# Patient Record
Sex: Male | Born: 1955 | Race: White | Hispanic: No | Marital: Married | Smoking: Former smoker
Health system: Southern US, Community
[De-identification: ages and names within clinical notes are randomized; demographics above are authoritative.]

## PROBLEM LIST (undated history)

## (undated) HISTORY — PX: BACK SURGERY: SHX140

## (undated) HISTORY — PX: HERNIA REPAIR: SHX51

---

## 2005-10-28 ENCOUNTER — Ambulatory Visit (HOSPITAL_COMMUNITY): Admission: RE | Admit: 2005-10-28 | Discharge: 2005-10-28 | Payer: Self-pay | Admitting: Neurosurgery

## 2010-07-07 ENCOUNTER — Emergency Department (HOSPITAL_BASED_OUTPATIENT_CLINIC_OR_DEPARTMENT_OTHER)
Admission: EM | Admit: 2010-07-07 | Discharge: 2010-07-08 | Payer: Self-pay | Source: Home / Self Care | Admitting: Emergency Medicine

## 2010-07-13 LAB — URINALYSIS, ROUTINE W REFLEX MICROSCOPIC
Bilirubin Urine: NEGATIVE
Hgb urine dipstick: NEGATIVE
Ketones, ur: NEGATIVE mg/dL
Nitrite: NEGATIVE
Protein, ur: NEGATIVE mg/dL
Specific Gravity, Urine: 1.007 (ref 1.005–1.030)
Urine Glucose, Fasting: NEGATIVE mg/dL
Urobilinogen, UA: 0.2 mg/dL (ref 0.0–1.0)
pH: 7.5 (ref 5.0–8.0)

## 2010-07-13 LAB — DIFFERENTIAL
Basophils Absolute: 0 10*3/uL (ref 0.0–0.1)
Basophils Relative: 1 % (ref 0–1)
Eosinophils Absolute: 0.2 10*3/uL (ref 0.0–0.7)
Eosinophils Relative: 2 % (ref 0–5)
Lymphocytes Relative: 43 % (ref 12–46)
Lymphs Abs: 3.3 10*3/uL (ref 0.7–4.0)
Monocytes Absolute: 0.6 10*3/uL (ref 0.1–1.0)
Monocytes Relative: 8 % (ref 3–12)
Neutro Abs: 3.5 10*3/uL (ref 1.7–7.7)
Neutrophils Relative %: 46 % (ref 43–77)

## 2010-07-13 LAB — CBC
HCT: 41 % (ref 39.0–52.0)
Hemoglobin: 14.5 g/dL (ref 13.0–17.0)
MCH: 29.6 pg (ref 26.0–34.0)
MCHC: 35.4 g/dL (ref 30.0–36.0)
MCV: 83.7 fL (ref 78.0–100.0)
Platelets: 205 10*3/uL (ref 150–400)
RBC: 4.9 MIL/uL (ref 4.22–5.81)
RDW: 12.6 % (ref 11.5–15.5)
WBC: 7.5 10*3/uL (ref 4.0–10.5)

## 2010-07-13 LAB — COMPREHENSIVE METABOLIC PANEL
ALT: 24 U/L (ref 0–53)
AST: 26 U/L (ref 0–37)
Albumin: 4.2 g/dL (ref 3.5–5.2)
Alkaline Phosphatase: 81 U/L (ref 39–117)
BUN: 16 mg/dL (ref 6–23)
CO2: 24 mEq/L (ref 19–32)
Calcium: 9.6 mg/dL (ref 8.4–10.5)
Chloride: 105 mEq/L (ref 96–112)
Creatinine, Ser: 1.2 mg/dL (ref 0.4–1.5)
GFR calc Af Amer: >60 mL/min (ref >60)
GFR calc non Af Amer: >60 mL/min (ref >60)
Glucose, Bld: 107 mg/dL — ABNORMAL HIGH (ref 70–99)
Potassium: 4.4 mEq/L (ref 3.5–5.1)
Sodium: 141 mEq/L (ref 135–145)
Total Bilirubin: 0.6 mg/dL (ref 0.3–1.2)
Total Protein: 7.5 g/dL (ref 6.0–8.3)

## 2010-10-27 ENCOUNTER — Emergency Department (HOSPITAL_BASED_OUTPATIENT_CLINIC_OR_DEPARTMENT_OTHER)
Admission: EM | Admit: 2010-10-27 | Discharge: 2010-10-27 | Disposition: A | Discharge status: Home / Self Care | Payer: Managed Care, Other (non HMO) | Attending: Emergency Medicine | Admitting: Emergency Medicine

## 2010-10-27 DIAGNOSIS — R197 Diarrhea, unspecified: Secondary | ICD-10-CM | POA: Insufficient documentation

## 2010-10-27 DIAGNOSIS — K219 Gastro-esophageal reflux disease without esophagitis: Secondary | ICD-10-CM | POA: Insufficient documentation

## 2010-10-27 DIAGNOSIS — E860 Dehydration: Secondary | ICD-10-CM | POA: Insufficient documentation

## 2010-10-27 DIAGNOSIS — R112 Nausea with vomiting, unspecified: Secondary | ICD-10-CM | POA: Insufficient documentation

## 2010-10-27 DIAGNOSIS — R109 Unspecified abdominal pain: Secondary | ICD-10-CM | POA: Insufficient documentation

## 2010-10-27 LAB — COMPREHENSIVE METABOLIC PANEL
ALT: 48 U/L (ref 0–53)
AST: 39 U/L — ABNORMAL HIGH (ref 0–37)
Albumin: 4.2 g/dL (ref 3.5–5.2)
Alkaline Phosphatase: 58 U/L (ref 39–117)
BUN: 19 mg/dL (ref 6–23)
CO2: 24 mEq/L (ref 19–32)
Calcium: 9.1 mg/dL (ref 8.4–10.5)
Chloride: 103 mEq/L (ref 96–112)
Creatinine, Ser: 1.3 mg/dL (ref 0.4–1.5)
GFR calc Af Amer: >60 mL/min (ref >60)
GFR calc non Af Amer: 57 mL/min — ABNORMAL LOW (ref >60)
Glucose, Bld: 96 mg/dL (ref 70–99)
Potassium: 3.7 mEq/L (ref 3.5–5.1)
Sodium: 138 mEq/L (ref 135–145)
Total Bilirubin: 0.3 mg/dL (ref 0.3–1.2)
Total Protein: 7.5 g/dL (ref 6.0–8.3)

## 2010-10-27 LAB — URINALYSIS, ROUTINE W REFLEX MICROSCOPIC
Bilirubin Urine: NEGATIVE
Glucose, UA: NEGATIVE mg/dL
Hgb urine dipstick: NEGATIVE
Ketones, ur: NEGATIVE mg/dL
Nitrite: NEGATIVE
Protein, ur: NEGATIVE mg/dL
Specific Gravity, Urine: 1.037 — ABNORMAL HIGH (ref 1.005–1.030)
Urobilinogen, UA: 0.2 mg/dL (ref 0.0–1.0)
pH: 5.5 (ref 5.0–8.0)

## 2010-10-27 LAB — DIFFERENTIAL
Basophils Absolute: 0 10*3/uL (ref 0.0–0.1)
Basophils Relative: 1 % (ref 0–1)
Eosinophils Absolute: 0.1 10*3/uL (ref 0.0–0.7)
Eosinophils Relative: 2 % (ref 0–5)
Lymphocytes Relative: 41 % (ref 12–46)
Lymphs Abs: 2 10*3/uL (ref 0.7–4.0)
Monocytes Absolute: 0.6 10*3/uL (ref 0.1–1.0)
Monocytes Relative: 12 % (ref 3–12)
Neutro Abs: 2.1 10*3/uL (ref 1.7–7.7)
Neutrophils Relative %: 44 % (ref 43–77)

## 2010-10-27 LAB — CBC
HCT: 39.3 % (ref 39.0–52.0)
Hemoglobin: 13.7 g/dL (ref 13.0–17.0)
MCH: 29.7 pg (ref 26.0–34.0)
MCHC: 34.9 g/dL (ref 30.0–36.0)
MCV: 85.1 fL (ref 78.0–100.0)
Platelets: 171 10*3/uL (ref 150–400)
RBC: 4.62 MIL/uL (ref 4.22–5.81)
RDW: 12.8 % (ref 11.5–15.5)
WBC: 4.8 10*3/uL (ref 4.0–10.5)

## 2010-10-27 LAB — LIPASE, BLOOD: Lipase: 22 U/L (ref 11–59)

## 2010-11-13 NOTE — Op Note (Signed)
NAMEDECLIN, RAJAN                 ACCOUNT NO.:  [id number]   MEDICAL RECORD NO.:  [id number]          PATIENT TYPE:  AMB   LOCATION:  SDS                          FACILITY:  [facility]   PHYSICIAN:  Reinaldo Meeker, M.D. DATE OF BIRTH:  02/02/1956   DATE OF PROCEDURE:  10/28/2005  DATE OF DISCHARGE:                                 OPERATIVE REPORT   PREOPERATIVE DIAGNOSIS:  Herniated disk, L5-S1 left.   POSTOPERATIVE DIAGNOSIS:  Herniated disk, L5-S1 left.   OPERATION PERFORMED:  Left L5-S1 intralaminal laminotomy for excision of  herniated disk with operating microscope.   SECONDARY PROCEDURE:  Microdissection, L5-S1 disk and S1 nerve root.   SURGEON:  Reinaldo Meeker, M.D.   ASSISTANT:  Tia Alert, MD   ANESTHESIA:  General.   DESCRIPTION OF PROCEDURE:  After being placed in the prone position, the  patient's back was shaved, prepped and draped in the usual sterile fashion.  A localizing x-ray was taken prior to the incision to identify the  appropriate level.  A midline incision was made above the spinous processes  of L5 and S1.  Using Bovie cutting current, the incision was carried down to  the spinous processes. Subperiosteal dissection was carried out along the  left side of the spinous processes and lamina and self-retaining retractor  was placed for exposure.  X-ray showed approach to the appropriate level.  Using a high speed drill, the inferior one third of the L5 lamina and medial  one third of the facet joint were removed.  Drill was then used to remove  the superior one third of the S1 lamina.  Residual bone and ligamentum  flavum were removed in a piecemeal fashion.  The microscope was draped,  brought into the field and used for the remainder of the case.  Using  microdissection technique, the lateral aspect of the thecal sac and S1 nerve  root were identified.  Further coagulation was carried out down to the floor  of the canal to identify the L5-S1 disk  which was found to be markedly  herniated beneath the nerve root.  After coagulating out the annulus, the  annulus was incised with a 15 blade.  There was some calcified spur merging  with the annulus and this was incised with the Kerrison punch and osteophyte  remover.  A thorough disk space clean out was carried out.  At this time  inspection was carried out in all directions for any evidence of residual  compression and none could be identified.  Large amounts of irrigation were  carried out and any bleeding controlled with bipolar coagulation and  Gelfoam.  The wound was then closed in multiple layers of Vicryl in the  muscle, fascia, subcutaneous and subcuticular tissues and staples on the  skin.  A sterile dressing was then applied and the patient was extubated and  taken to the recovery room in stable condition.          ______________________________  Reinaldo Meeker, M.D.    ROK/MEDQ  D:  10/28/2005  T:  10/29/2005  Job:  [id number] 

## 2011-10-11 ENCOUNTER — Other Ambulatory Visit: Payer: Self-pay | Admitting: Internal Medicine

## 2011-10-12 LAB — LIPID PANEL
Cholesterol: 166 mg/dL (ref 0–200)
HDL: 45 mg/dL (ref >39)
LDL Cholesterol: 105 mg/dL — ABNORMAL HIGH (ref 0–99)
Total CHOL/HDL Ratio: 3.7 Ratio
Triglycerides: 81 mg/dL (ref <150)
VLDL: 16 mg/dL (ref 0–40)

## 2011-10-12 LAB — COMPLETE METABOLIC PANEL WITH GFR
ALT: 21 U/L (ref 0–53)
AST: 32 U/L (ref 0–37)
Albumin: 4.4 g/dL (ref 3.5–5.2)
Alkaline Phosphatase: 55 U/L (ref 39–117)
BUN: 17 mg/dL (ref 6–23)
CO2: 26 mEq/L (ref 19–32)
Calcium: 9.4 mg/dL (ref 8.4–10.5)
Chloride: 104 mEq/L (ref 96–112)
Creat: 1.21 mg/dL (ref 0.50–1.35)
GFR, Est African American: 76 mL/min
GFR, Est Non African American: 66 mL/min
Glucose, Bld: 105 mg/dL — ABNORMAL HIGH (ref 70–99)
Potassium: 4.3 mEq/L (ref 3.5–5.3)
Sodium: 138 mEq/L (ref 135–145)
Total Bilirubin: 0.5 mg/dL (ref 0.3–1.2)
Total Protein: 6.8 g/dL (ref 6.0–8.3)

## 2011-10-15 ENCOUNTER — Encounter: Payer: Self-pay | Admitting: *Deleted

## 2012-06-01 ENCOUNTER — Emergency Department (HOSPITAL_COMMUNITY)
Admission: EM | Admit: 2012-06-01 | Discharge: 2012-06-01 | Disposition: A | Discharge status: Home / Self Care | Payer: Managed Care, Other (non HMO) | Attending: Emergency Medicine | Admitting: Emergency Medicine

## 2012-06-01 ENCOUNTER — Encounter (HOSPITAL_COMMUNITY): Payer: Self-pay | Admitting: Nurse Practitioner

## 2012-06-01 ENCOUNTER — Emergency Department (HOSPITAL_COMMUNITY): Payer: Managed Care, Other (non HMO)

## 2012-06-01 DIAGNOSIS — R61 Generalized hyperhidrosis: Secondary | ICD-10-CM | POA: Insufficient documentation

## 2012-06-01 DIAGNOSIS — Z8719 Personal history of other diseases of the digestive system: Secondary | ICD-10-CM | POA: Insufficient documentation

## 2012-06-01 DIAGNOSIS — G8929 Other chronic pain: Secondary | ICD-10-CM | POA: Insufficient documentation

## 2012-06-01 DIAGNOSIS — Z79899 Other long term (current) drug therapy: Secondary | ICD-10-CM | POA: Insufficient documentation

## 2012-06-01 DIAGNOSIS — R55 Syncope and collapse: Secondary | ICD-10-CM | POA: Insufficient documentation

## 2012-06-01 DIAGNOSIS — R42 Dizziness and giddiness: Secondary | ICD-10-CM | POA: Insufficient documentation

## 2012-06-01 DIAGNOSIS — Z87891 Personal history of nicotine dependence: Secondary | ICD-10-CM | POA: Insufficient documentation

## 2012-06-01 DIAGNOSIS — R109 Unspecified abdominal pain: Secondary | ICD-10-CM | POA: Insufficient documentation

## 2012-06-01 DIAGNOSIS — E785 Hyperlipidemia, unspecified: Secondary | ICD-10-CM | POA: Insufficient documentation

## 2012-06-01 DIAGNOSIS — H538 Other visual disturbances: Secondary | ICD-10-CM | POA: Insufficient documentation

## 2012-06-01 LAB — GLUCOSE, CAPILLARY: Glucose-Capillary: 93 mg/dL (ref 70–99)

## 2012-06-01 LAB — CBC WITH DIFFERENTIAL/PLATELET
Basophils Absolute: 0 10*3/uL (ref 0.0–0.1)
Basophils Relative: 0 % (ref 0–1)
Eosinophils Absolute: 0.1 10*3/uL (ref 0.0–0.7)
Eosinophils Relative: 1 % (ref 0–5)
HCT: 42.3 % (ref 39.0–52.0)
Hemoglobin: 14.6 g/dL (ref 13.0–17.0)
Lymphocytes Relative: 13 % (ref 12–46)
Lymphs Abs: 1.6 10*3/uL (ref 0.7–4.0)
MCH: 29.7 pg (ref 26.0–34.0)
MCHC: 34.5 g/dL (ref 30.0–36.0)
MCV: 86 fL (ref 78.0–100.0)
Monocytes Absolute: 0.8 10*3/uL (ref 0.1–1.0)
Monocytes Relative: 7 % (ref 3–12)
Neutro Abs: 10.2 10*3/uL — ABNORMAL HIGH (ref 1.7–7.7)
Neutrophils Relative %: 80 % — ABNORMAL HIGH (ref 43–77)
Platelets: 189 10*3/uL (ref 150–400)
RBC: 4.92 MIL/uL (ref 4.22–5.81)
RDW: 12.5 % (ref 11.5–15.5)
WBC: 12.8 10*3/uL — ABNORMAL HIGH (ref 4.0–10.5)

## 2012-06-01 LAB — BASIC METABOLIC PANEL
BUN: 21 mg/dL (ref 6–23)
CO2: 27 mEq/L (ref 19–32)
Calcium: 9.7 mg/dL (ref 8.4–10.5)
Chloride: 99 mEq/L (ref 96–112)
Creatinine, Ser: 1.26 mg/dL (ref 0.50–1.35)
GFR calc Af Amer: 71 mL/min — ABNORMAL LOW (ref >90)
GFR calc non Af Amer: 61 mL/min — ABNORMAL LOW (ref >90)
Glucose, Bld: 124 mg/dL — ABNORMAL HIGH (ref 70–99)
Potassium: 3.9 mEq/L (ref 3.5–5.1)
Sodium: 138 mEq/L (ref 135–145)

## 2012-06-01 LAB — TROPONIN I
Troponin I: <0.3 ng/mL (ref <0.30)
Troponin I: <0.3 ng/mL (ref <0.30)

## 2012-06-01 NOTE — ED Notes (Signed)
Per ems: pt was sitting at work and suddenly felt lightheaded, nauseated, diaphoretic, "felt restricted and had to loosen his tie." on standing to move he felt he was going to pass out but does remember all events. Neuro intact en route, pt c/o mild headache, symptoms at onset have resolved. C/o chronic abd pain which he is currently being worked up for with no diagnosis. A&Ox4

## 2012-06-01 NOTE — ED Provider Notes (Signed)
I saw and evaluated the patient, reviewed the resident's note and I agree with the findings and plan. I have reviewed EKG and agree with the resident interpretation.  Patient with syncopal episode today. A prolonged prodromal period with no true loss of consciousness. Patient denied chest pain, shortness of breath or palpitations. EKG without any concerning symptoms. On exam he is asymptomatic a well-appearing. No prior cardiac history or issues with syncope in the past. No recent medication changes. All labs within normal limits including adult troponin. Patient was discharged  Gwyneth Sprout, MD 06/01/12 2324

## 2012-06-01 NOTE — ED Provider Notes (Signed)
History     CSN: [id number]  Arrival date & time 06/01/12  1658   First MD Initiated Contact with Patient 06/01/12 1744     Chief Complaint  Patient presents with  . Near Syncope   HPI: George Kerr is a 56 yo CM with history of HLD, IBS, chronic abdominal pain, who presents for evaluation of near syncopal episode. 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 He was sitting down at work, he suddenly became light-headed and diaphoretic. He stood up and walked a few feet to another room, he continued to feel lightheaded. He then fell to the floor due to his legs "giving out." He felt as if he was going to pass out but did not have LOC. He stood up after 5-10 seconds, continued to feel lightheaded and was diaphoretic. He attempted to go to the bathroom to urinate but he was unable as his "hands did not work," for a few seconds causing him to urinate on himself. He again sat down and all symptoms resolved within 15 minutes. He denies any CP, SOB, headache, blurred vision, dark stools, abdominal pain, or recent illnesses. He has never had symptoms similar to this in the past.   History reviewed. No pertinent past medical history.  Past Surgical History  Procedure Date  . Back surgery   . Hernia repair     History reviewed. No pertinent family history.  History  Substance Use Topics  . Smoking status: Former Games developer  . Smokeless tobacco: Not on file  . Alcohol Use: Yes     Comment: occasional     Review of Systems  Constitutional: Positive for diaphoresis. Negative for fever, chills, appetite change and fatigue.  HENT: Negative for congestion and rhinorrhea.   Eyes: Positive for visual disturbance (blurred vision during event). Negative for photophobia.  Respiratory: Negative for cough and shortness of breath.   Cardiovascular: Negative for chest pain and palpitations.  Gastrointestinal: Negative for nausea, vomiting, abdominal pain and diarrhea.  Genitourinary: Negative for dysuria, decreased  urine volume and testicular pain.  Musculoskeletal: Negative for myalgias and arthralgias.  Skin: Negative for pallor and rash.  Neurological: Positive for dizziness, syncope (near syncope) and light-headedness. Negative for headaches.  Psychiatric/Behavioral: Negative for confusion and agitation.  All other systems reviewed and are negative.   Allergies  Review of patient's allergies indicates no known allergies.  Home Medications   Current Outpatient Rx  Name  Route  Sig  Dispense  Refill  . HYOSCYAMINE SULFATE ER 0.375 MG PO TB12   Oral   Take 0.375 mg by mouth daily.         Marland Kitchen OMEPRAZOLE 20 MG PO CPDR   Oral   Take 20 mg by mouth 2 (two) times daily.         Marland Kitchen SIMVASTATIN 10 MG PO TABS   Oral   Take 10 mg by mouth at bedtime.           BP 150/92  Pulse 86  Temp 97.9 F (36.6 C) (Oral)  Resp 16  SpO2 99%  Physical Exam  Nursing note and vitals reviewed. Constitutional: He is oriented to person, place, and time. He is cooperative.       Overweight gentleman, well-groomed, no acute distress   HENT:  Head: Normocephalic and atraumatic.  Mouth/Throat: Oropharynx is clear and moist and mucous membranes are normal.  Eyes: Conjunctivae normal and EOM are normal. Pupils are equal, round, and reactive to light.  Neck: Trachea normal  and normal range of motion. Neck supple. No JVD present.  Cardiovascular: Normal rate, regular rhythm, S1 normal, S2 normal and normal heart sounds.  Exam reveals no decreased pulses.   Pulmonary/Chest: Effort normal and breath sounds normal. He has no decreased breath sounds.  Abdominal: Soft. Normal appearance and bowel sounds are normal. There is no tenderness.  Musculoskeletal: Normal range of motion. He exhibits no edema.  Neurological: He is alert and oriented to person, place, and time. He has normal strength and normal reflexes. No cranial nerve deficit or sensory deficit. GCS eye subscore is 4. GCS verbal subscore is 5. GCS motor  subscore is 6.  Skin: Skin is warm and dry. No pallor.    ED Course  Procedures  Labs Reviewed  CBC WITH DIFFERENTIAL - Abnormal; Notable for the following:    WBC 12.8 (*)     Neutrophils Relative 80 (*)     Neutro Abs 10.2 (*)     All other components within normal limits  BASIC METABOLIC PANEL - Abnormal; Notable for the following:    Glucose, Bld 124 (*)     GFR calc non Af Amer 61 (*)     GFR calc Af Amer 71 (*)     All other components within normal limits  GLUCOSE, CAPILLARY  TROPONIN I  TROPONIN I   Dg Chest 2 View  06/01/2012  *RADIOLOGY REPORT*  Clinical Data: Near-syncope.  Hypercholesterolemia.  CHEST - 2 VIEW  Comparison:  02/29/2012  Findings:  The heart size and mediastinal contours are within normal limits.  Both lungs are clear.  The visualized skeletal structures are unremarkable.  IMPRESSION: No active cardiopulmonary disease.   Original Report Authenticated By: Myles Rosenthal, M.D.    1. Vasovagal near syncope    MDM   56 yo CM with history of HLD, IBS, chronic abdominal pain, who presents for evaluation of near syncopal episode. Afebrile, vital signs stable. Symptoms likely vasovagal as he had an extended prodrome prior, but concern for ACS as well. Given ASA. ECG without acute ischemia. CXR without acute cardiopulmonary disease. Initial troponin normal. WBC elevated to 12.8, unknown significance. Hgb 14.6. Normal lytes, glucose 124, cr mildly elevated. Felt head CT was not indicated as neurologic exam normal. Delta troponin negative as well, doubt ACS as ECG without ischemia, no CP or SOB, negative troponin's. No high risk features such as CHF, cardiac murmur, no WPW, prolonged QT or Brugada to warrant inpatient admission. Felt he was stable for outpatient management as no significant findings on w/u or exam, no recurrent symptoms, has close outpatient f/u. Advised to f/u with PCP in next 2-3 days. Return precautions given.   Reviewed imaging, labs and previous medical  records, utilized in MDM  Discussed case with Dr. Anitra Lauth  Clinical Impression 1. Vasovagal presyncope 2. Leukocytosis 3. Hyperglycemia        Margie Billet, MD 06/01/12 2259

## 2012-09-13 IMAGING — CT CT ABD-PELV W/ CM
2 of 5 series · 16 of 46 positions shown, 18 images · IV contrast (APPLIED)
Comparison: None

CLINICAL DATA: Abdominal and pelvic pain.

CT ABDOMEN AND PELVIS WITH CONTRAST
TECHNIQUE: Multidetector CT imaging of the abdomen and pelvis was
performed following the standard protocol during bolus
administration of intravenous contrast.
Contrast: 100 ml intravenous Cmnipaque-[id number]

[Series 2: abd/pelvis 5.0 b31f · axial · 0.70mm/px · z∈[+848,+1268]mm · 13 of 94 slices shown, 15 images]
[im 5/94  soft-tissue]
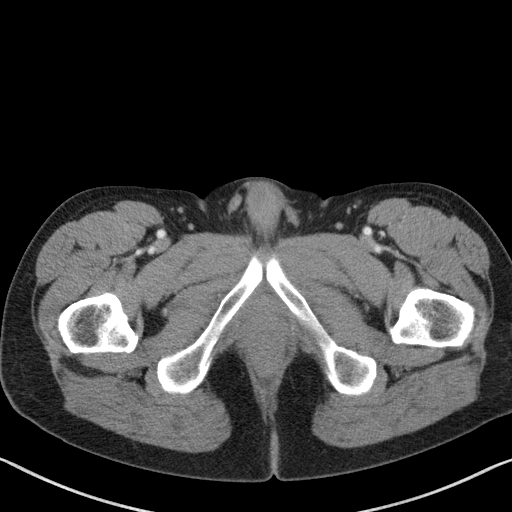
[im 5/94  bone]
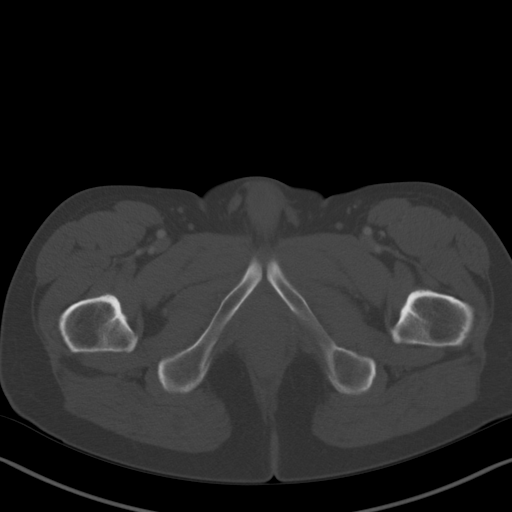
[im 14/94  soft-tissue]
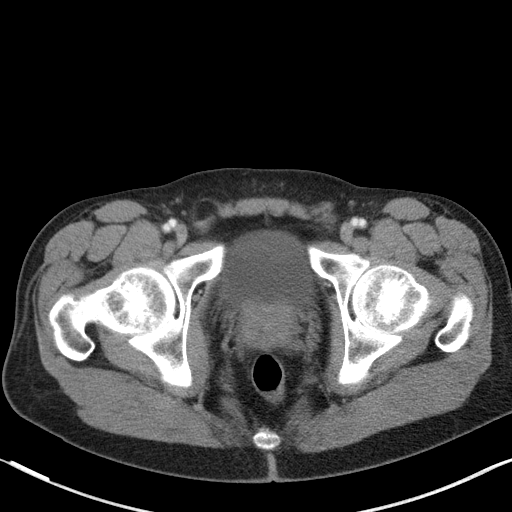
[im 19/94  soft-tissue]
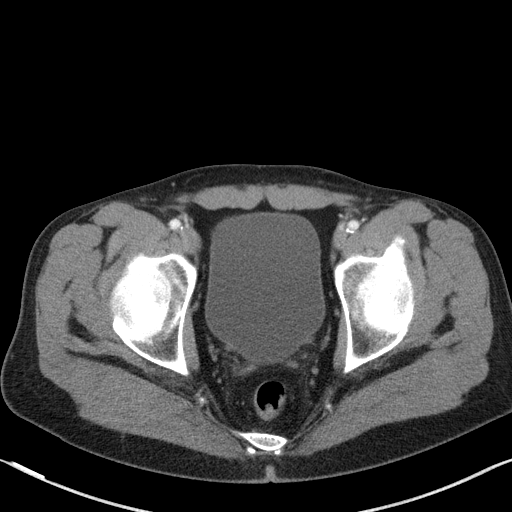
[im 28/94  soft-tissue]
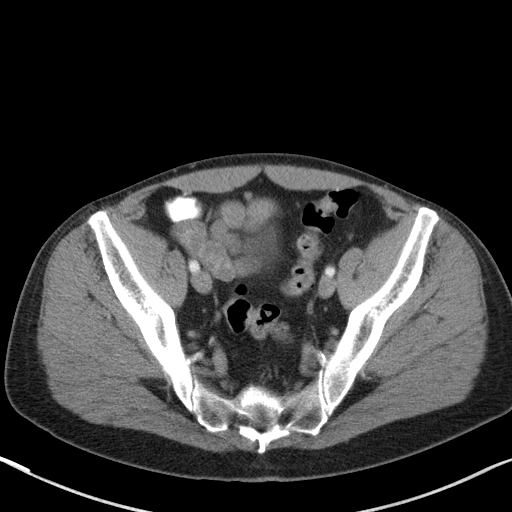
[im 33/94  soft-tissue]
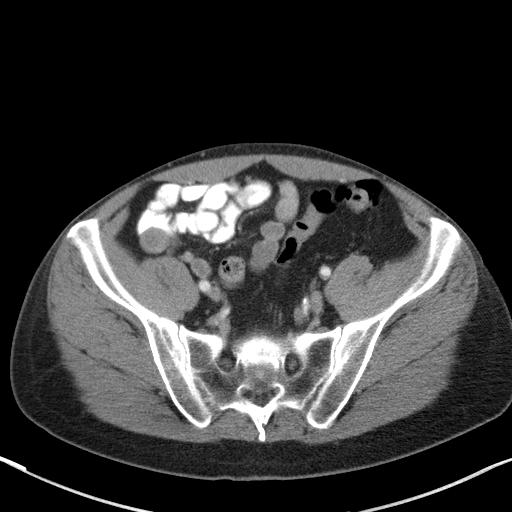
[im 42/94  soft-tissue]
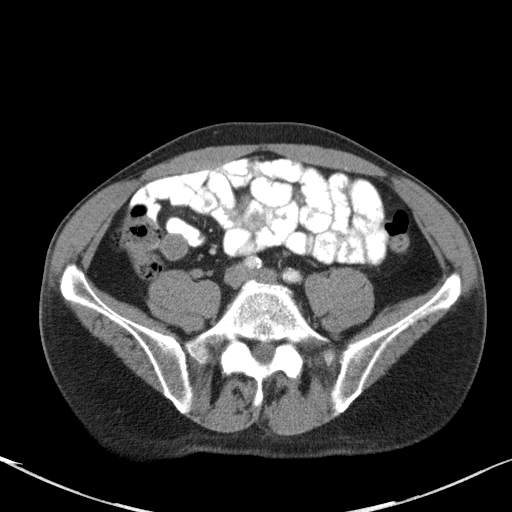
[im 47/94  soft-tissue]
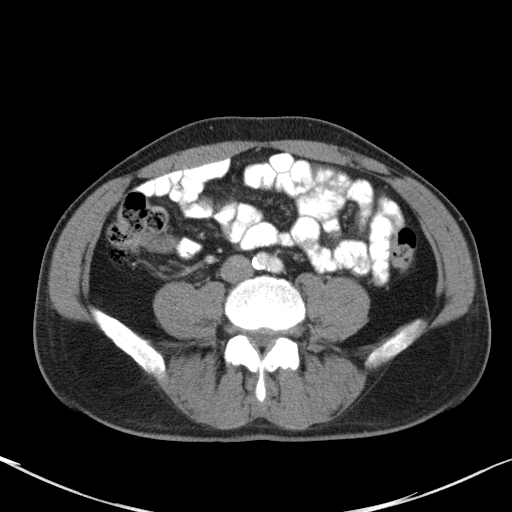
[im 52/94  soft-tissue]
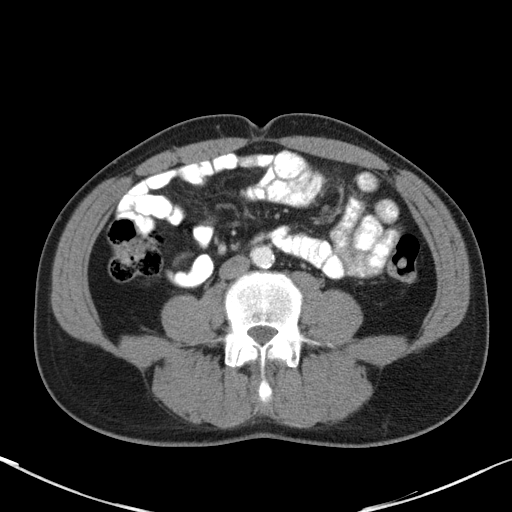
[im 61/94  soft-tissue]
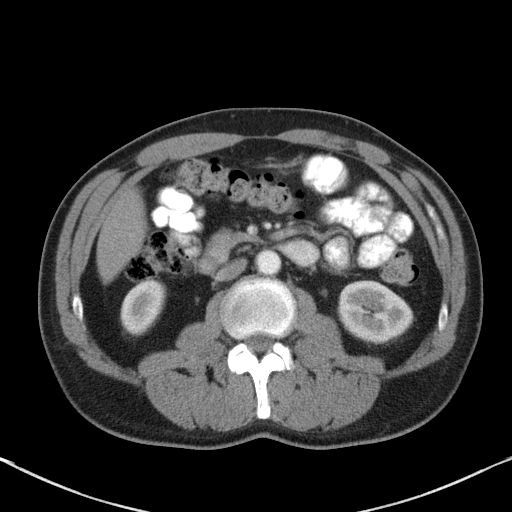
[im 61/94  bone]
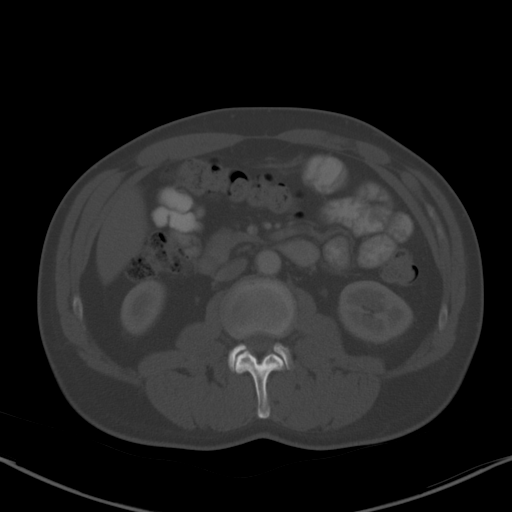
[im 66/94  soft-tissue]
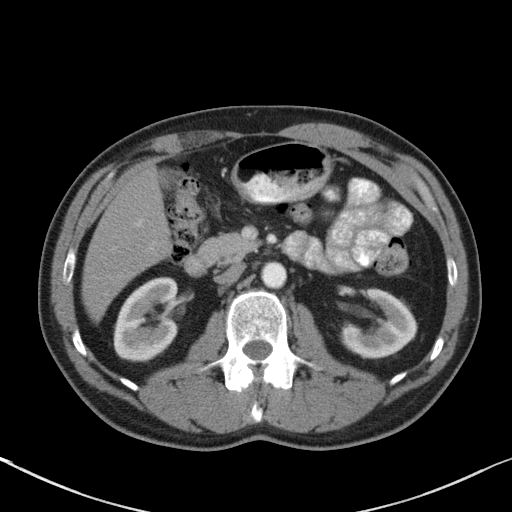
[im 75/94  soft-tissue]
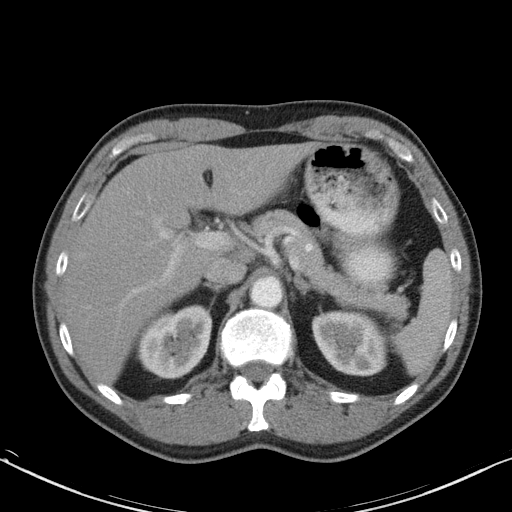
[im 80/94  soft-tissue]
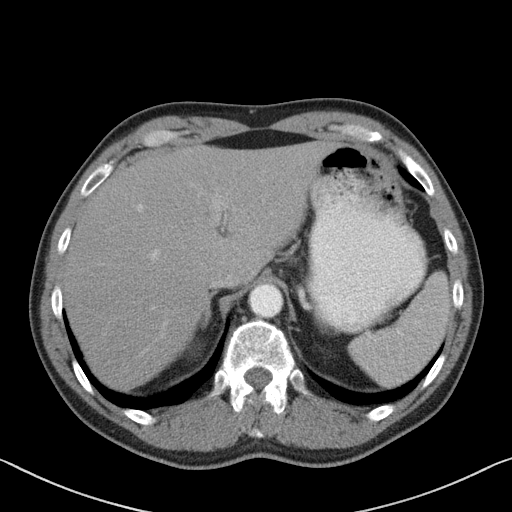
[im 89/94  soft-tissue]
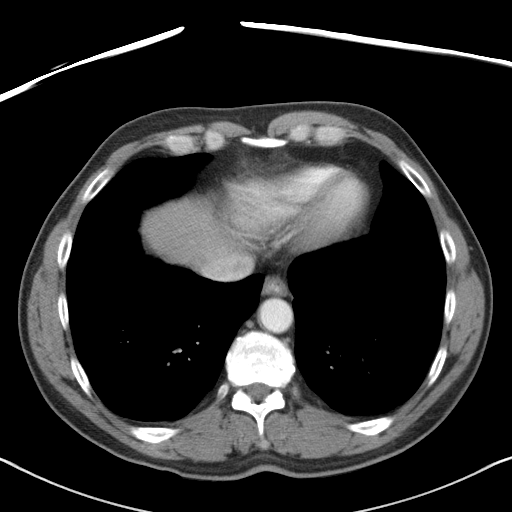

[Series 5: abd/pelvis 3.0 coronal · coronal · 0.67mm/px · 3 of 85 slices shown]
[im 29/85  soft-tissue]
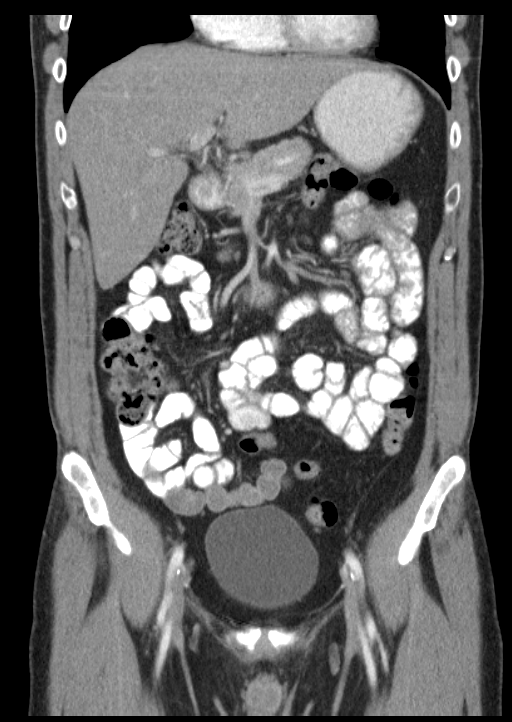
[im 38/85  soft-tissue]
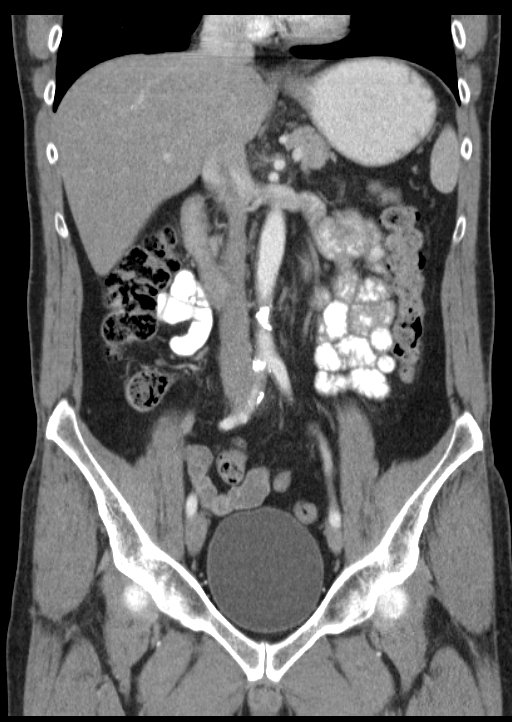
[im 47/85  soft-tissue]
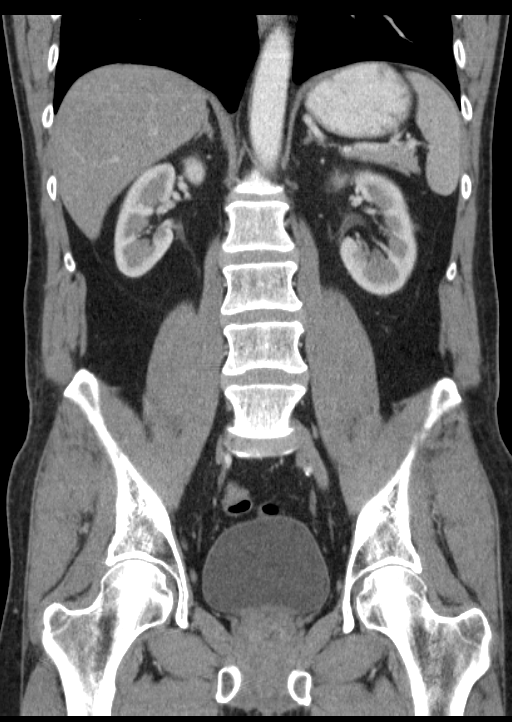

[16 of 46 positions shown; findings below may reference images not displayed]

FINDINGS: The liver, spleen, pancreas, adrenal glands, gallbladder
and kidneys are unremarkable except for a probable small right
renal cyst.

No free fluid, enlarged lymph nodes, biliary dilation or abdominal
aortic aneurysm identified.

The bowel and bladder are within normal limits.
No acute or suspicious bony abnormalities are identified.
Moderate degenerative changes at L5-S1 noted.

There is no evidence of left inguinal/femoral hernia.
IMPRESSION: No evidence of acute abnormality.

## 2013-10-19 ENCOUNTER — Other Ambulatory Visit: Payer: Self-pay | Admitting: Family Medicine

## 2013-10-20 LAB — CBC WITH DIFFERENTIAL/PLATELET

## 2013-10-20 LAB — COMPLETE METABOLIC PANEL WITH GFR
ALT: 24 U/L (ref 0–53)
AST: 25 U/L (ref 0–37)
Alkaline Phosphatase: 48 U/L (ref 39–117)
BUN: 20 mg/dL (ref 6–23)
Calcium: 9.2 mg/dL (ref 8.4–10.5)
Chloride: 100 mEq/L (ref 96–112)
Creat: 1.12 mg/dL (ref 0.50–1.35)
Total Bilirubin: 0.5 mg/dL (ref 0.2–1.2)

## 2013-10-20 LAB — COMPLETE METABOLIC PANEL WITHOUT GFR
Albumin: 4.1 g/dL (ref 3.5–5.2)
CO2: 27 meq/L (ref 19–32)
GFR, Est African American: 82 mL/min
GFR, Est Non African American: 71 mL/min
Glucose, Bld: 97 mg/dL (ref 70–99)
Potassium: 4.9 meq/L (ref 3.5–5.3)
Sodium: 138 meq/L (ref 135–145)
Total Protein: 6.7 g/dL (ref 6.0–8.3)

## 2013-10-20 LAB — LIPID PANEL
Cholesterol: 168 mg/dL (ref 0–200)
HDL: 39 mg/dL — ABNORMAL LOW (ref >39)
LDL Cholesterol: 113 mg/dL — ABNORMAL HIGH (ref 0–99)
Total CHOL/HDL Ratio: 4.3 Ratio
Triglycerides: 82 mg/dL (ref <150)
VLDL: 16 mg/dL (ref 0–40)

## 2014-11-23 ENCOUNTER — Encounter (HOSPITAL_BASED_OUTPATIENT_CLINIC_OR_DEPARTMENT_OTHER): Payer: Self-pay

## 2014-11-23 ENCOUNTER — Emergency Department (HOSPITAL_BASED_OUTPATIENT_CLINIC_OR_DEPARTMENT_OTHER)
Admission: EM | Admit: 2014-11-23 | Discharge: 2014-11-23 | Disposition: A | Discharge status: Home / Self Care | Payer: Managed Care, Other (non HMO) | Attending: Emergency Medicine | Admitting: Emergency Medicine

## 2014-11-23 DIAGNOSIS — M545 Low back pain, unspecified: Secondary | ICD-10-CM

## 2014-11-23 DIAGNOSIS — Z79899 Other long term (current) drug therapy: Secondary | ICD-10-CM | POA: Insufficient documentation

## 2014-11-23 DIAGNOSIS — Z87891 Personal history of nicotine dependence: Secondary | ICD-10-CM | POA: Insufficient documentation

## 2014-11-23 DIAGNOSIS — Z7982 Long term (current) use of aspirin: Secondary | ICD-10-CM | POA: Diagnosis not present

## 2014-11-23 MED ORDER — CYCLOBENZAPRINE HCL 10 MG PO TABS: 10.0000 mg | ORAL_TABLET | Freq: Three times a day (TID) | ORAL | Status: AC | PRN

## 2014-11-23 MED ORDER — OXYCODONE-ACETAMINOPHEN 5-325 MG PO TABS: 1.0000 | ORAL_TABLET | ORAL | Status: AC | PRN

## 2014-11-23 MED ORDER — HYDROMORPHONE HCL 1 MG/ML IJ SOLN
1.0000 mg | Freq: Once | INTRAMUSCULAR | Status: AC
  Administered 2014-11-23: 1 mg via INTRAMUSCULAR
  Filled 2014-11-23: qty 1

## 2014-11-23 MED ORDER — ONDANSETRON 8 MG PO TBDP
8.0000 mg | ORAL_TABLET | Freq: Once | ORAL | Status: AC
  Administered 2014-11-23: 8 mg via ORAL
  Filled 2014-11-23: qty 1

## 2014-11-23 NOTE — ED Provider Notes (Signed)
CSN: [id number]     Arrival date & time 11/23/14  1650 History  This chart was scribed for Gilda Crease, MD by Evon Slack, ED Scribe. This patient was seen in room MH10/MH10 and the patient's care was started at 5:04 PM.    Chief Complaint  Patient presents with  . Back Pain   Patient is a 59 y.o. male presenting with back pain. The history is provided by the patient. No language interpreter was used.  Back Pain  HPI Comments: George Kerr is a 58 y.o. male with PSHx of back surgery and hernia repair who presents to the Emergency Department complaining of lower left sided back pain onset 1 day prior. Pt states that the pain intermittently radiates around to his right lower back. Pt states that it is painful to walk. Pt states that the pain is worse with movement and position changes. Pt has tried motrin with no relief. Pt denies injury or fall. Denies numbness, weakness, bladder/bowel incontinence, dysuria, hematuria, or difficulty urinating. Pt states that he has a Hx of L5 disc removed in 2007-2008.    History reviewed. No pertinent past medical history. Past Surgical History  Procedure Laterality Date  . Back surgery    . Hernia repair     No family history on file. History  Substance Use Topics  . Smoking status: Former Games developer  . Smokeless tobacco: Not on file  . Alcohol Use: Yes     Comment: occasional    Review of Systems  Musculoskeletal: Positive for back pain.  All other systems reviewed and are negative.     Allergies  Review of patient's allergies indicates no known allergies.  Home Medications   Prior to Admission medications   Medication Sig Start Date End Date Taking? Authorizing Provider  aspirin EC 81 MG tablet Take 81 mg by mouth daily.   Yes Historical Provider, MD  nortriptyline (PAMELOR) 25 MG capsule Take 25 mg by mouth at bedtime.   Yes Historical Provider, MD  cyclobenzaprine (FLEXERIL) 10 MG tablet Take 1 tablet (10 mg total) by mouth  3 (three) times daily as needed for muscle spasms. 11/23/14   Gilda Crease, MD  hyoscyamine (LEVBID) 0.375 MG 12 hr tablet Take 0.375 mg by mouth daily.    Historical Provider, MD  omeprazole (PRILOSEC) 20 MG capsule Take 20 mg by mouth 2 (two) times daily.    Historical Provider, MD  oxyCODONE-acetaminophen (PERCOCET) 5-325 MG per tablet Take 1-2 tablets by mouth every 4 (four) hours as needed. 11/23/14   Gilda Crease, MD  simvastatin (ZOCOR) 10 MG tablet Take 10 mg by mouth at bedtime.    Historical Provider, MD   BP 139/73 mmHg  Pulse 80  Temp(Src) 98.1 F (36.7 C) (Oral)  Resp 20  Ht 6' (1.829 m)  Wt 205 lb (92.987 kg)  BMI 27.80 kg/m2  SpO2 99%   Physical Exam  Constitutional: He is oriented to person, place, and time. He appears well-developed and well-nourished. No distress.  HENT:  Head: Normocephalic and atraumatic.  Right Ear: Hearing normal.  Left Ear: Hearing normal.  Nose: Nose normal.  Mouth/Throat: Oropharynx is clear and moist and mucous membranes are normal.  Eyes: Conjunctivae and EOM are normal. Pupils are equal, round, and reactive to light.  Neck: Normal range of motion. Neck supple.  Cardiovascular: Regular rhythm, S1 normal and S2 normal.  Exam reveals no gallop and no friction rub.   No murmur heard. Pulmonary/Chest: Effort normal and  breath sounds normal. No respiratory distress. He exhibits no tenderness.  Abdominal: Soft. Normal appearance and bowel sounds are normal. There is no hepatosplenomegaly. There is no tenderness. There is no rebound, no guarding, no tenderness at McBurney's point and negative Murphy's sign. No hernia.  Musculoskeletal: Normal range of motion.  Left low paraspinal tenderness.   Neurological: He is alert and oriented to person, place, and time. He has normal strength. No cranial nerve deficit or sensory deficit. Coordination normal. GCS eye subscore is 4. GCS verbal subscore is 5. GCS motor subscore is 6.  Skin: Skin  is warm, dry and intact. No rash noted. No cyanosis.  Psychiatric: He has a normal mood and affect. His speech is normal and behavior is normal. Thought content normal.  Nursing note and vitals reviewed.   ED Course  Procedures (including critical care time) DIAGNOSTIC STUDIES: Oxygen Saturation is 99% on RA, normal by my interpretation.    COORDINATION OF CARE: 5:18 PM-Discussed treatment plan with pt at bedside and pt agreed to plan.     Labs Review Labs Reviewed - No data to display  Imaging Review No results found.   EKG Interpretation None      MDM   Final diagnoses:  Left-sided low back pain without sciatica   Patient presents to the ER for evaluation of pain in the left lower back. Patient has severe worsening of his pain with movement. This is very clearly musculoskeletal in nature. He denies urinary symptoms. He is does not have foot drop, radiation of pain, saddle anesthesia. Patient has had significant improvement with analgesia. He'll be discharged, treated with analgesia and muscle relaxer, follow-up with primary care.   I personally performed the services described in this documentation, which was scribed in my presence. The recorded information has been reviewed and is accurate.       Gilda Creasehristopher J Treyveon Mochizuki, MD 11/23/14 [id number]

## 2014-11-23 NOTE — Discharge Instructions (Signed)
Back Pain, Adult Low back pain is very common. About 1 in 5 people have back pain.The cause of low back pain is rarely dangerous. The pain often gets better over time.About half of people with a sudden onset of back pain feel better in just 2 weeks. About 8 in 10 people feel better by 6 weeks.  CAUSES Some common causes of back pain include:  Strain of the muscles or ligaments supporting the spine.  Wear and tear (degeneration) of the spinal discs.  Arthritis.  Direct injury to the back. DIAGNOSIS Most of the time, the direct cause of low back pain is not known.However, back pain can be treated effectively even when the exact cause of the pain is unknown.Answering your caregiver's questions about your overall health and symptoms is one of the most accurate ways to make sure the cause of your pain is not dangerous. If your caregiver needs more information, he or she may order lab work or imaging tests (X-rays or MRIs).However, even if imaging tests show changes in your back, this usually does not require surgery. HOME CARE INSTRUCTIONS For many people, back pain returns.Since low back pain is rarely dangerous, it is often a condition that people can learn to manageon their own.   Remain active. It is stressful on the back to sit or stand in one place. Do not sit, drive, or stand in one place for more than 30 minutes at a time. Take short walks on level surfaces as soon as pain allows.Try to increase the length of time you walk each day.  Do not stay in bed.Resting more than 1 or 2 days can delay your recovery.  Do not avoid exercise or work.Your body is made to move.It is not dangerous to be active, even though your back may hurt.Your back will likely heal faster if you return to being active before your pain is gone.  Pay attention to your body when you bend and lift. Many people have less discomfortwhen lifting if they bend their knees, keep the load close to their bodies,and  avoid twisting. Often, the most comfortable positions are those that put less stress on your recovering back.  Find a comfortable position to sleep. Use a firm mattress and lie on your side with your knees slightly bent. If you lie on your back, put a pillow under your knees.  Only take over-the-counter or prescription medicines as directed by your caregiver. Over-the-counter medicines to reduce pain and inflammation are often the most helpful.Your caregiver may prescribe muscle relaxant drugs.These medicines help dull your pain so you can more quickly return to your normal activities and healthy exercise.  Put ice on the injured area.  Put ice in a plastic bag.  Place a towel between your skin and the bag.  Leave the ice on for 15-20 minutes, 03-04 times a day for the first 2 to 3 days. After that, ice and heat may be alternated to reduce pain and spasms.  Ask your caregiver about trying back exercises and gentle massage. This may be of some benefit.  Avoid feeling anxious or stressed.Stress increases muscle tension and can worsen back pain.It is important to recognize when you are anxious or stressed and learn ways to manage it.Exercise is a great option. SEEK MEDICAL CARE IF:  You have pain that is not relieved with rest or medicine.  You have pain that does not improve in 1 week.  You have new symptoms.  You are generally not feeling well. SEEK   IMMEDIATE MEDICAL CARE IF:   You have pain that radiates from your back into your legs.  You develop new bowel or bladder control problems.  You have unusual weakness or numbness in your arms or legs.  You develop nausea or vomiting.  You develop abdominal pain.  You feel faint. Document Released: 06/14/2005 Document Revised: 12/14/2011 Document Reviewed: 10/16/2013 [employer] Patient Information 2015 [employer]. This information is not intended to replace advice given to you by your health care provider. Make sure you  discuss any questions you have with your health care provider.  

## 2014-11-23 NOTE — ED Notes (Signed)
Patient here with lower back pain x 1 day, denies trauma. Reports that he can't stand due to the pain
